# Patient Record
Sex: Female | Born: 1965 | Race: White | Hispanic: No | Smoking: Former smoker
Health system: Southern US, Community
[De-identification: ages and names within clinical notes are randomized; demographics above are authoritative.]

## PROBLEM LIST (undated history)

## (undated) DIAGNOSIS — G35 Multiple sclerosis: Secondary | ICD-10-CM

## (undated) DIAGNOSIS — F419 Anxiety disorder, unspecified: Secondary | ICD-10-CM

## (undated) DIAGNOSIS — G8929 Other chronic pain: Secondary | ICD-10-CM

## (undated) DIAGNOSIS — F32A Depression, unspecified: Secondary | ICD-10-CM

## (undated) DIAGNOSIS — F329 Major depressive disorder, single episode, unspecified: Secondary | ICD-10-CM

## (undated) HISTORY — PX: APPENDECTOMY: SHX54

## (undated) HISTORY — DX: Multiple sclerosis: G35

## (undated) HISTORY — DX: Major depressive disorder, single episode, unspecified: F32.9

## (undated) HISTORY — DX: Other chronic pain: G89.29

## (undated) HISTORY — DX: Anxiety disorder, unspecified: F41.9

## (undated) HISTORY — DX: Depression, unspecified: F32.A

## (undated) HISTORY — PX: PARTIAL COLECTOMY: SHX5273

---

## 1998-02-23 ENCOUNTER — Encounter (HOSPITAL_COMMUNITY): Admission: RE | Admit: 1998-02-23 | Discharge: 1998-05-24 | Payer: Self-pay | Admitting: Psychiatry

## 1999-05-16 ENCOUNTER — Inpatient Hospital Stay (HOSPITAL_COMMUNITY): Admission: AD | Admit: 1999-05-16 | Discharge: 1999-05-19 | Payer: Self-pay | Admitting: Obstetrics & Gynecology

## 2001-09-15 ENCOUNTER — Emergency Department (HOSPITAL_COMMUNITY): Admission: EM | Admit: 2001-09-15 | Discharge: 2001-09-16 | Payer: Self-pay | Admitting: Emergency Medicine

## 2002-02-06 ENCOUNTER — Emergency Department (HOSPITAL_COMMUNITY): Admission: EM | Admit: 2002-02-06 | Discharge: 2002-02-07 | Payer: Self-pay | Admitting: Emergency Medicine

## 2004-03-02 ENCOUNTER — Emergency Department (HOSPITAL_COMMUNITY): Admission: EM | Admit: 2004-03-02 | Discharge: 2004-03-02 | Payer: Self-pay | Admitting: *Deleted

## 2006-10-16 ENCOUNTER — Emergency Department (HOSPITAL_COMMUNITY): Admission: EM | Admit: 2006-10-16 | Discharge: 2006-10-16 | Payer: Self-pay | Admitting: Emergency Medicine

## 2008-05-23 ENCOUNTER — Inpatient Hospital Stay (HOSPITAL_COMMUNITY): Admission: AD | Admit: 2008-05-23 | Discharge: 2008-05-24 | Payer: Self-pay | Admitting: Obstetrics & Gynecology

## 2011-07-04 ENCOUNTER — Other Ambulatory Visit: Payer: Self-pay | Admitting: Diagnostic Neuroimaging

## 2011-07-04 DIAGNOSIS — R200 Anesthesia of skin: Secondary | ICD-10-CM

## 2011-07-04 DIAGNOSIS — R531 Weakness: Secondary | ICD-10-CM

## 2011-07-10 ENCOUNTER — Ambulatory Visit
Admission: RE | Admit: 2011-07-10 | Discharge: 2011-07-10 | Disposition: A | Discharge status: Home / Self Care | Payer: Self-pay | Source: Ambulatory Visit | Attending: Diagnostic Neuroimaging | Admitting: Diagnostic Neuroimaging

## 2011-07-10 DIAGNOSIS — R200 Anesthesia of skin: Secondary | ICD-10-CM

## 2011-07-10 DIAGNOSIS — R531 Weakness: Secondary | ICD-10-CM

## 2011-07-10 NOTE — Progress Notes (Signed)
Blood drawn for tests ordered by neurologist from right [facility] space; site unremarkable.

## 2011-08-22 LAB — COMPREHENSIVE METABOLIC PANEL
AST: 15
Albumin: 3.5
Calcium: 8.7
Creatinine, Ser: 0.61
GFR calc Af Amer: >60
Sodium: 136

## 2011-08-22 LAB — CBC
MCHC: 34.8
MCV: 98.1
Platelets: 292
WBC: 9.3

## 2011-08-22 LAB — URINALYSIS, ROUTINE W REFLEX MICROSCOPIC
Glucose, UA: NEGATIVE
Ketones, ur: 15 — AB
Leukocytes, UA: NEGATIVE
Nitrite: NEGATIVE
Specific Gravity, Urine: 1.02
pH: 5.5

## 2011-08-22 LAB — WET PREP, GENITAL
Clue Cells Wet Prep HPF POC: NONE SEEN
Trich, Wet Prep: NONE SEEN
Yeast Wet Prep HPF POC: NONE SEEN

## 2011-08-22 LAB — URINE MICROSCOPIC-ADD ON

## 2011-08-22 LAB — POCT PREGNANCY, URINE: Operator id: 220991

## 2012-02-06 DIAGNOSIS — M545 Low back pain, unspecified: Secondary | ICD-10-CM | POA: Diagnosis not present

## 2012-02-06 DIAGNOSIS — M542 Cervicalgia: Secondary | ICD-10-CM | POA: Diagnosis not present

## 2012-02-06 DIAGNOSIS — G894 Chronic pain syndrome: Secondary | ICD-10-CM | POA: Diagnosis not present

## 2012-04-01 DIAGNOSIS — N951 Menopausal and female climacteric states: Secondary | ICD-10-CM | POA: Diagnosis not present

## 2012-04-01 DIAGNOSIS — G894 Chronic pain syndrome: Secondary | ICD-10-CM | POA: Diagnosis not present

## 2012-05-01 DIAGNOSIS — G894 Chronic pain syndrome: Secondary | ICD-10-CM | POA: Diagnosis not present

## 2012-05-01 DIAGNOSIS — F339 Major depressive disorder, recurrent, unspecified: Secondary | ICD-10-CM | POA: Diagnosis not present

## 2012-05-26 DIAGNOSIS — F339 Major depressive disorder, recurrent, unspecified: Secondary | ICD-10-CM | POA: Diagnosis not present

## 2012-05-26 DIAGNOSIS — M722 Plantar fascial fibromatosis: Secondary | ICD-10-CM | POA: Diagnosis not present

## 2012-05-26 DIAGNOSIS — G894 Chronic pain syndrome: Secondary | ICD-10-CM | POA: Diagnosis not present

## 2012-07-22 DIAGNOSIS — F411 Generalized anxiety disorder: Secondary | ICD-10-CM | POA: Diagnosis not present

## 2012-07-22 DIAGNOSIS — G35 Multiple sclerosis: Secondary | ICD-10-CM | POA: Diagnosis not present

## 2012-07-22 DIAGNOSIS — G894 Chronic pain syndrome: Secondary | ICD-10-CM | POA: Diagnosis not present

## 2012-09-21 DIAGNOSIS — G35 Multiple sclerosis: Secondary | ICD-10-CM | POA: Diagnosis not present

## 2012-09-21 DIAGNOSIS — F411 Generalized anxiety disorder: Secondary | ICD-10-CM | POA: Diagnosis not present

## 2012-09-21 DIAGNOSIS — G894 Chronic pain syndrome: Secondary | ICD-10-CM | POA: Diagnosis not present

## 2012-12-14 DIAGNOSIS — G894 Chronic pain syndrome: Secondary | ICD-10-CM | POA: Diagnosis not present

## 2013-01-18 DIAGNOSIS — G894 Chronic pain syndrome: Secondary | ICD-10-CM | POA: Diagnosis not present

## 2013-03-08 DIAGNOSIS — F411 Generalized anxiety disorder: Secondary | ICD-10-CM | POA: Diagnosis not present

## 2013-03-08 DIAGNOSIS — G894 Chronic pain syndrome: Secondary | ICD-10-CM | POA: Diagnosis not present

## 2013-05-05 DIAGNOSIS — G894 Chronic pain syndrome: Secondary | ICD-10-CM | POA: Diagnosis not present

## 2013-07-13 DIAGNOSIS — G894 Chronic pain syndrome: Secondary | ICD-10-CM | POA: Diagnosis not present

## 2013-10-11 ENCOUNTER — Telehealth: Payer: Self-pay

## 2013-10-11 DIAGNOSIS — F411 Generalized anxiety disorder: Secondary | ICD-10-CM | POA: Diagnosis not present

## 2013-10-11 DIAGNOSIS — G894 Chronic pain syndrome: Secondary | ICD-10-CM | POA: Diagnosis not present

## 2013-10-11 DIAGNOSIS — G35 Multiple sclerosis: Secondary | ICD-10-CM | POA: Diagnosis not present

## 2013-10-11 NOTE — Telephone Encounter (Signed)
Spoke to patient. Scheduled appt per Dr. Marjory Lies on 10/12/13.  Patient agreed.

## 2013-10-12 ENCOUNTER — Ambulatory Visit (INDEPENDENT_AMBULATORY_CARE_PROVIDER_SITE_OTHER): Payer: Medicare Other | Admitting: Diagnostic Neuroimaging

## 2013-10-12 ENCOUNTER — Encounter: Payer: Self-pay | Admitting: Diagnostic Neuroimaging

## 2013-10-12 VITALS — BP 115/77 | HR 61 | Temp 97.9°F | Ht 63.5 in | Wt 215.0 lb

## 2013-10-12 DIAGNOSIS — G894 Chronic pain syndrome: Secondary | ICD-10-CM

## 2013-10-12 DIAGNOSIS — F319 Bipolar disorder, unspecified: Secondary | ICD-10-CM

## 2013-10-12 DIAGNOSIS — G35 Multiple sclerosis: Secondary | ICD-10-CM

## 2013-10-12 DIAGNOSIS — R5381 Other malaise: Secondary | ICD-10-CM | POA: Diagnosis not present

## 2013-10-12 NOTE — Progress Notes (Signed)
[facility]  PATIENT: Christina Shea DOB: July 26, 1966  REFERRING CLINICIAN: Foy Guadalajara HISTORY FROM: patient  REASON FOR VISIT: follow up   HISTORICAL  CHIEF COMPLAINT:  Chief Complaint  Patient presents with  . Follow-up    Rv# MS    HISTORY OF PRESENT ILLNESS:   UPDATE 10/12/13: Last visit patient did not followup. I had advised her to consider Copaxone at last visit 2012. However patient was concerned about side effects medication and opted to continue with her pain management and mood stabilization with her PCP Dr. Foy Guadalajara.  Over the past one year she has had intermittent feelings of fatigue, malaise, diffuse pain. She denies any focal numbness or weakness. No unilateral visual abnormalities. No stumbling or falls. Sunday she feels like she cannot out of bed. She struggles with significant psychosocial stressors. She is having difficulty even getting out of bed and out of the house to go to her doctor's appointments. She has not seen psychiatry currently. She does feel that pain and mood play a strong role in her outlook on life.  I reviewed prior testing from 2012 which shows stable left frontal focus of gliosis in the brain and ill-defined T2 hyperintense lesions in the cervical spinal cord. Lumbar puncture was negative for oligoclonal bands, and showed normal IgG index.  PRIOR HPI (06/04/11): 47 year old right-handed female with history of multiple sclerosis, chronic pain, bipolar disorder, anxiety here for evaluation of multiple sclerosis.  June 2000, patient was 1 week post partum and given a diagnosis of multiple sclerosis. She had developed left face numbness for several days during her pregnancy. Over time she's had intermittent episodes of numbness and weakness in her legs. She also has balance difficulties chronic pain cognitive difficulty.  Last MRI 12/24/06 showed solitart left frontal focus of gliosis (5x60mm).  She has not had MRI of her spine or LP ever.      She was on betaseron from 2000 - 2002.  She had to stop due to flu-like side effects.  Then lost to follow up.  Came back to Dr. Thad Ranger briefly in 2008, was planned to start avonex, but then lost to follow up again.   REVIEW OF SYSTEMS: Full 14 system review of systems performed and notable only for weakness depression anxiety decrease aching muscles feeling hot weight gain.  ALLERGIES: No Known Allergies  HOME MEDICATIONS: Outpatient Prescriptions Prior to Visit  Medication Sig Dispense Refill  . ALPRAZolam (XANAX) 1 MG tablet Take 1 mg by mouth 3 (three) times daily.      Marland Kitchen oxycodone (ROXICODONE) 30 MG immediate release tablet Take 30 mg by mouth 3 (three) times daily as needed for pain (2 four times a day prn).       Marland Kitchen oxymorphone (OPANA ER) 40 MG 12 hr tablet Take 40 mg by mouth every 12 (twelve) hours.      . sertraline (ZOLOFT) 100 MG tablet Take 100 mg by mouth 2 (two) times daily.       . methadone (DOLOPHINE) 10 MG tablet Take 10 mg by mouth 2 (two) times daily as needed.       No facility-administered medications prior to visit.    PAST MEDICAL HISTORY: Past Medical History  Diagnosis Date  . Multiple sclerosis   . Chronic pain   . Depression   . Anxiety     PAST SURGICAL HISTORY: Past Surgical History  Procedure Laterality Date  . Appendectomy  [id number]  . Partial colectomy  [phone]  FAMILY HISTORY: History reviewed. No pertinent family history.  SOCIAL HISTORY:  History   Social History  . Marital Status: Legally Separated    Spouse Name: N/A    Number of Children: 2  . Years of Education: HS   Occupational History  . Not on file.   Social History Main Topics  . Smoking status: Never Smoker   . Smokeless tobacco: Not on file  . Alcohol Use: No     Comment: occasionally  . Drug Use: No  . Sexual Activity: No   Other Topics Concern  . Not on file   Social History Narrative   Patient lives at home with her family.   Caffeine Use:  daily     PHYSICAL EXAM  Filed Vitals:   10/12/13 1325  BP: 115/77  Pulse: 61  Temp: 97.9 F (36.6 C)  TempSrc: Oral  Height: 5' 3.5" (1.613 m)  Weight: 215 lb (97.523 kg)    Not recorded    Body mass index is 37.48 kg/(m^2).  GENERAL EXAM: Patient is in no distress  CARDIOVASCULAR: Regular rate and rhythm, no murmurs, no carotid bruits  NEUROLOGIC: MENTAL STATUS: awake, alert, language fluent, comprehension intact, naming intact CRANIAL NERVE: no papilledema on fundoscopic exam, pupils equal and reactive to light, visual fields full to confrontation, extraocular muscles intact, no nystagmus, facial sensation and strength symmetric, uvula midline, shoulder shrug symmetric, tongue midline. MOTOR: normal bulk and tone, full strength in the BUE, BLE SENSORY: normal and symmetric to light touch, temperature, vibration COORDINATION: finger-nose-finger, fine finger movements normal REFLEXES: deep tendon reflexes present and symmetric; BRISK AT KNEES. GAIT/STATION: narrow based gait; able to walk on toes, heels and tandem; romberg is negative   DIAGNOSTIC DATA (LABS, IMAGING, TESTING) - I reviewed patient records, labs, notes, testing and imaging myself where available.  Lab Results  Component Value Date   WBC 9.3 05/23/2008   HGB 12.7 05/23/2008   HCT 36.7 05/23/2008   MCV 98.1 05/23/2008   PLT 292 05/23/2008      Component Value Date/Time   NA 136 05/23/2008 2250   K 3.5 05/23/2008 2250   CL 102 05/23/2008 2250   CO2 27 05/23/2008 2250   GLUCOSE 89 05/23/2008 2250   BUN 6 05/23/2008 2250   CREATININE 0.61 05/23/2008 2250   CALCIUM 8.7 05/23/2008 2250   PROT 6.6 05/23/2008 2250   ALBUMIN 3.5 05/23/2008 2250   AST 15 05/23/2008 2250   ALT 15 05/23/2008 2250   ALKPHOS 52 05/23/2008 2250   BILITOT 0.7 05/23/2008 2250   GFRNONAA >60 05/23/2008 2250   GFRAA  Value: >60        The eGFR has been calculated using the MDRD equation. This calculation has not been validated in all  clinical 05/23/2008 2250   No results found for this basename: CHOL, HDL, LDLCALC, LDLDIRECT, TRIG, CHOLHDL   No results found for this basename: HGBA1C   No results found for this basename: VITAMINB12   No results found for this basename: TSH    07/11/11 B12 613 HIV, ANA, ANCA, RPR - neg CSF: OCBs negative, IgG index 0.51 (nl <0.66), glucose 64, protein 42  06/07/11 MRI brain - solitary nonspecific left posterior frontal white matter hyperintensity which is unchanged from MRI 12/24/06.  06/07/11 MRI cervical spine - minor disc degenerative changes. Ill defined spinal cord hyperintensities at C 2, C 3 and C 4 may represent remote age demyelinating plaques. No enhancing lesions are noted.  06/11/11 MRI thoracic spine -  normal    ASSESSMENT AND PLAN  47 y.o. year old female here with long-standing chronic pain, bipolar disorder, with prior diagnosis of possible multiple sclerosis. When I repeated her workup it is still unclear whether she truly has multiple sclerosis (stable MRI from 2008 to 2012 and LP negative for oligoclonal bands). Also her ongoing difficulties over the past year are somewhat generalized and vague, and not necessarily indicative of a multiple sclerosis exacerbation warranting IV steroids at this time. I would like to check MRI of the brain and cervical spine, and confirm whether or not she has acute/enhancing demyelinating lesions. Also we would like to determine if she has had some progression since 2012. Based on this we may consider IV steroids and disease modifying therapy such as Copaxone or interferon.   PLAN: - repeat MRIs to look for progression or flare up - then may consider steroids + DMT (copaxone or plegridy) - continue pain mgmt and mood stabilization per PCP; consider psychology/psychiatry evaluation  Orders Placed This Encounter  Procedures  . MR Brain W Wo Contrast  . MR Cervical Spine W Wo Contrast    Return in about 3 months (around 01/12/2014)  for with Heide Guile or Nancyjo Givhan.    Suanne Marker, MD 10/12/2013, 2:28 PM Certified in Neurology, Neurophysiology and Neuroimaging  [facility] [address] [place], [place] [address] [phone]

## 2013-10-12 NOTE — Patient Instructions (Signed)
I will check MRI scans. 

## 2013-10-28 ENCOUNTER — Other Ambulatory Visit: Payer: Self-pay

## 2013-10-28 ENCOUNTER — Inpatient Hospital Stay: Admission: RE | Admit: 2013-10-28 | Payer: Self-pay | Source: Ambulatory Visit

## 2013-11-04 ENCOUNTER — Encounter: Payer: Self-pay | Admitting: Diagnostic Neuroimaging

## 2013-11-23 DIAGNOSIS — G894 Chronic pain syndrome: Secondary | ICD-10-CM | POA: Diagnosis not present

## 2014-01-14 ENCOUNTER — Ambulatory Visit: Payer: Medicare Other | Admitting: Nurse Practitioner

## 2014-01-14 ENCOUNTER — Telehealth: Payer: Self-pay | Admitting: Nurse Practitioner

## 2014-01-14 NOTE — Telephone Encounter (Signed)
Patient was no show for appointment

## 2014-02-17 DIAGNOSIS — G894 Chronic pain syndrome: Secondary | ICD-10-CM | POA: Diagnosis not present

## 2014-05-19 DIAGNOSIS — F411 Generalized anxiety disorder: Secondary | ICD-10-CM | POA: Diagnosis not present

## 2014-05-19 DIAGNOSIS — G894 Chronic pain syndrome: Secondary | ICD-10-CM | POA: Diagnosis not present

## 2014-06-13 DIAGNOSIS — F411 Generalized anxiety disorder: Secondary | ICD-10-CM | POA: Diagnosis not present

## 2014-06-13 DIAGNOSIS — G894 Chronic pain syndrome: Secondary | ICD-10-CM | POA: Diagnosis not present

## 2014-06-13 DIAGNOSIS — Z79899 Other long term (current) drug therapy: Secondary | ICD-10-CM | POA: Diagnosis not present

## 2014-08-10 ENCOUNTER — Emergency Department (HOSPITAL_COMMUNITY): Payer: Medicare Other

## 2014-08-10 ENCOUNTER — Emergency Department (HOSPITAL_COMMUNITY)
Admission: EM | Admit: 2014-08-10 | Discharge: 2014-08-10 | Disposition: A | Discharge status: Home / Self Care | Payer: Medicare Other | Attending: Emergency Medicine | Admitting: Emergency Medicine

## 2014-08-10 ENCOUNTER — Encounter (HOSPITAL_COMMUNITY): Payer: Self-pay | Admitting: Emergency Medicine

## 2014-08-10 DIAGNOSIS — F411 Generalized anxiety disorder: Secondary | ICD-10-CM | POA: Diagnosis not present

## 2014-08-10 DIAGNOSIS — Z8669 Personal history of other diseases of the nervous system and sense organs: Secondary | ICD-10-CM | POA: Diagnosis not present

## 2014-08-10 DIAGNOSIS — F3289 Other specified depressive episodes: Secondary | ICD-10-CM | POA: Insufficient documentation

## 2014-08-10 DIAGNOSIS — R109 Unspecified abdominal pain: Secondary | ICD-10-CM | POA: Diagnosis not present

## 2014-08-10 DIAGNOSIS — R197 Diarrhea, unspecified: Secondary | ICD-10-CM | POA: Insufficient documentation

## 2014-08-10 DIAGNOSIS — R112 Nausea with vomiting, unspecified: Secondary | ICD-10-CM | POA: Diagnosis not present

## 2014-08-10 DIAGNOSIS — Z79899 Other long term (current) drug therapy: Secondary | ICD-10-CM | POA: Insufficient documentation

## 2014-08-10 DIAGNOSIS — R5381 Other malaise: Secondary | ICD-10-CM | POA: Diagnosis not present

## 2014-08-10 DIAGNOSIS — R1110 Vomiting, unspecified: Secondary | ICD-10-CM

## 2014-08-10 DIAGNOSIS — Z0389 Encounter for observation for other suspected diseases and conditions ruled out: Secondary | ICD-10-CM | POA: Diagnosis not present

## 2014-08-10 DIAGNOSIS — G8929 Other chronic pain: Secondary | ICD-10-CM | POA: Insufficient documentation

## 2014-08-10 DIAGNOSIS — F329 Major depressive disorder, single episode, unspecified: Secondary | ICD-10-CM | POA: Insufficient documentation

## 2014-08-10 DIAGNOSIS — R404 Transient alteration of awareness: Secondary | ICD-10-CM | POA: Diagnosis not present

## 2014-08-10 LAB — COMPREHENSIVE METABOLIC PANEL
ALBUMIN: 4 g/dL (ref 3.5–5.2)
ALK PHOS: 85 U/L (ref 39–117)
ALT: 17 U/L (ref 0–35)
ANION GAP: 17 — AB (ref 5–15)
AST: 28 U/L (ref 0–37)
BILIRUBIN TOTAL: 0.5 mg/dL (ref 0.3–1.2)
BUN: 6 mg/dL (ref 6–23)
CHLORIDE: 106 meq/L (ref 96–112)
CO2: 20 mEq/L (ref 19–32)
Calcium: 9.8 mg/dL (ref 8.4–10.5)
Creatinine, Ser: 0.69 mg/dL (ref 0.50–1.10)
GFR calc Af Amer: >90 mL/min (ref >90)
GFR calc non Af Amer: >90 mL/min (ref >90)
Glucose, Bld: 121 mg/dL — ABNORMAL HIGH (ref 70–99)
POTASSIUM: 3.4 meq/L — AB (ref 3.7–5.3)
Sodium: 143 mEq/L (ref 137–147)
TOTAL PROTEIN: 8.4 g/dL — AB (ref 6.0–8.3)

## 2014-08-10 LAB — CBC WITH DIFFERENTIAL/PLATELET
BASOS ABS: 0 10*3/uL (ref 0.0–0.1)
BASOS PCT: 0 % (ref 0–1)
EOS ABS: 0 10*3/uL (ref 0.0–0.7)
Eosinophils Relative: 0 % (ref 0–5)
HCT: 41.7 % (ref 36.0–46.0)
Hemoglobin: 14.3 g/dL (ref 12.0–15.0)
Lymphocytes Relative: 15 % (ref 12–46)
Lymphs Abs: 1.3 10*3/uL (ref 0.7–4.0)
MCH: 30.6 pg (ref 26.0–34.0)
MCHC: 34.3 g/dL (ref 30.0–36.0)
MCV: 89.3 fL (ref 78.0–100.0)
Monocytes Absolute: 0.6 10*3/uL (ref 0.1–1.0)
Monocytes Relative: 7 % (ref 3–12)
NEUTROS ABS: 6.8 10*3/uL (ref 1.7–7.7)
NEUTROS PCT: 78 % — AB (ref 43–77)
PLATELETS: 303 10*3/uL (ref 150–400)
RBC: 4.67 MIL/uL (ref 3.87–5.11)
RDW: 15.1 % (ref 11.5–15.5)
WBC: 8.7 10*3/uL (ref 4.0–10.5)

## 2014-08-10 LAB — LIPASE, BLOOD: LIPASE: 61 U/L — AB (ref 11–59)

## 2014-08-10 LAB — TROPONIN I: Troponin I: <0.3 ng/mL (ref <0.30)

## 2014-08-10 MED ORDER — HYDROMORPHONE HCL 1 MG/ML IJ SOLN
1.0000 mg | Freq: Once | INTRAMUSCULAR | Status: AC
  Administered 2014-08-10: 1 mg via INTRAVENOUS
  Filled 2014-08-10: qty 1

## 2014-08-10 MED ORDER — ONDANSETRON HCL 4 MG/2ML IJ SOLN
4.0000 mg | Freq: Once | INTRAMUSCULAR | Status: AC
  Administered 2014-08-10: 4 mg via INTRAVENOUS
  Filled 2014-08-10: qty 2

## 2014-08-10 MED ORDER — METOCLOPRAMIDE HCL 5 MG/ML IJ SOLN
10.0000 mg | Freq: Once | INTRAMUSCULAR | Status: AC
  Administered 2014-08-10: 10 mg via INTRAVENOUS
  Filled 2014-08-10: qty 2

## 2014-08-10 MED ORDER — LOPERAMIDE HCL 2 MG PO CAPS: 2.0000 mg | ORAL_CAPSULE | ORAL | Status: AC | PRN

## 2014-08-10 MED ORDER — LORAZEPAM 2 MG/ML IJ SOLN
1.0000 mg | Freq: Once | INTRAMUSCULAR | Status: AC
Start: 2014-08-10 — End: 2014-08-10
  Administered 2014-08-10: 1 mg via INTRAVENOUS
  Filled 2014-08-10: qty 1

## 2014-08-10 MED ORDER — PROMETHAZINE HCL 25 MG PO TABS: 25.0000 mg | ORAL_TABLET | Freq: Four times a day (QID) | ORAL | Status: AC | PRN

## 2014-08-10 MED ORDER — SODIUM CHLORIDE 0.9 % IV BOLUS (SEPSIS)
1000.0000 mL | Freq: Once | INTRAVENOUS | Status: AC
  Administered 2014-08-10: 1000 mL via INTRAVENOUS

## 2014-08-10 MED ORDER — HYDROMORPHONE HCL PF 1 MG/ML IJ SOLN
1.0000 mg | Freq: Once | INTRAMUSCULAR | Status: AC
  Administered 2014-08-10: 1 mg via INTRAVENOUS
  Filled 2014-08-10: qty 1

## 2014-08-10 NOTE — ED Notes (Signed)
MD aware patient unable to urinate. Aware patient is in pain and vomiting.

## 2014-08-10 NOTE — ED Provider Notes (Signed)
CSN: [id number]     Arrival date & time 08/10/14  6578 History   First MD Initiated Contact with Patient 08/10/14 [phone]     Chief Complaint  Patient presents with  . Nausea  . Emesis  . Diarrhea     (Consider location/radiation/quality/duration/timing/severity/associated sxs/prior Treatment) HPI Comments: Patient presents to the ER for evaluation of nausea, vomiting, diarrhea. Patient reports that symptoms began 2 days ago. She started with diarrhea and then started having nausea and vomiting. Patient has a history of chronic pain syndrome, on multiple narcotic analgesics regularly. She has not been able to hold these down. Patient is now complaining of pain all over. She cannot pinpoint her pain as, reports 10 out of 10 pain.  Patient is a 48 y.o. female presenting with vomiting and diarrhea.  Emesis Associated symptoms: diarrhea   Diarrhea Associated symptoms: vomiting     Past Medical History  Diagnosis Date  . Multiple sclerosis   . Chronic pain   . Depression   . Anxiety    Past Surgical History  Procedure Laterality Date  . Appendectomy  [id number]  . Partial colectomy  1989-1990   No family history on file. History  Substance Use Topics  . Smoking status: Never Smoker   . Smokeless tobacco: Not on file  . Alcohol Use: No     Comment: occasionally   OB History   Grav Para Term Preterm Abortions TAB SAB Ect Mult Living                 Review of Systems  Gastrointestinal: Positive for vomiting and diarrhea.  All other systems reviewed and are negative.     Allergies  Review of patient's allergies indicates no known allergies.  Home Medications   Prior to Admission medications   Medication Sig Start Date End Date Taking? Authorizing Provider  ALPRAZolam Prudy Feeler) 1 MG tablet Take 1 mg by mouth 3 (three) times daily.   Yes Historical Provider, MD  oxycodone (ROXICODONE) 30 MG immediate release tablet Take 30 mg by mouth 3 (three) times daily as needed for  pain (2 four times a day prn).    Yes Historical Provider, MD  oxymorphone (OPANA ER) 40 MG 12 hr tablet Take 40 mg by mouth every 12 (twelve) hours.   Yes Historical Provider, MD  sertraline (ZOLOFT) 100 MG tablet Take 100 mg by mouth 2 (two) times daily.   Yes Historical Provider, MD  loperamide (IMODIUM) 2 MG capsule Take 1 capsule (2 mg total) by mouth as needed for diarrhea or loose stools. 08/10/14   Gilda Crease, MD  promethazine (PHENERGAN) 25 MG tablet Take 1 tablet (25 mg total) by mouth every 6 (six) hours as needed for nausea or vomiting. 08/10/14   Gilda Crease, MD   BP 122/61  Pulse 60  Temp(Src) 98.2 F (36.8 C)  Resp 18  SpO2 95% Physical Exam  Constitutional: She is oriented to person, place, and time. She appears well-developed and well-nourished. No distress.  HENT:  Head: Normocephalic and atraumatic.  Right Ear: Hearing normal.  Left Ear: Hearing normal.  Nose: Nose normal.  Mouth/Throat: Oropharynx is clear and moist and mucous membranes are normal.  Eyes: Conjunctivae and EOM are normal. Pupils are equal, round, and reactive to light.  Neck: Normal range of motion. Neck supple.  Cardiovascular: Regular rhythm, S1 normal and S2 normal.  Exam reveals no gallop and no friction rub.   No murmur heard. Pulmonary/Chest: Effort normal and breath sounds  normal. No respiratory distress. She exhibits no tenderness.  Abdominal: Soft. Normal appearance and bowel sounds are normal. There is no hepatosplenomegaly. There is no tenderness. There is no rebound, no guarding, no tenderness at McBurney's point and negative Murphy's sign. No hernia.  Musculoskeletal: Normal range of motion.  Neurological: She is alert and oriented to person, place, and time. She has normal strength. No cranial nerve deficit or sensory deficit. Coordination normal. GCS eye subscore is 4. GCS verbal subscore is 5. GCS motor subscore is 6.  Skin: Skin is warm, dry and intact. No rash  noted. No cyanosis.  Psychiatric: She has a normal mood and affect. Her speech is normal and behavior is normal. Thought content normal.    ED Course  Procedures (including critical care time) Labs Review Labs Reviewed  CBC WITH DIFFERENTIAL - Abnormal; Notable for the following:    Neutrophils Relative % 78 (*)    All other components within normal limits  COMPREHENSIVE METABOLIC PANEL - Abnormal; Notable for the following:    Potassium 3.4 (*)    Glucose, Bld 121 (*)    Total Protein 8.4 (*)    Anion gap 17 (*)    All other components within normal limits  LIPASE, BLOOD - Abnormal; Notable for the following:    Lipase 61 (*)    All other components within normal limits  TROPONIN I  URINALYSIS, ROUTINE W REFLEX MICROSCOPIC  URINE RAPID DRUG SCREEN (HOSP PERFORMED)    Imaging Review Dg Abd Acute W/chest  08/10/2014   CLINICAL DATA:  Severe abdominal pain  EXAM: ACUTE ABDOMEN SERIES (ABDOMEN 2 VIEW & CHEST 1 VIEW)  COMPARISON:  None.  FINDINGS: Lungs are clear.  No free air beneath hemidiaphragm.  Midline loops of large or small bowel. Surgical clips in the right abdomen associated with the colon. Gas in the rectum. No organomegaly. No pathologic calcifications.  IMPRESSION: 1.  No acute cardiopulmonary process. 2. No bowel obstruction or  free air. 3. Prior bowel surgery in the right lower quadrant.   Electronically Signed   By: Genevive Bi M.D.   On: 08/10/2014 08:24     EKG Interpretation   Date/Time:  Wednesday August 10 2014 08:19:27 EDT Ventricular Rate:  63 PR Interval:  151 QRS Duration: 93 QT Interval:  452 QTC Calculation: 463 R Axis:   -30 Text Interpretation:  Normal sinus rhythm Left axis deviation Non-specific  ST-t changes No previous tracing Confirmed by POLLINA  MD, CHRISTOPHER  [phone]) on 08/10/2014 8:24:30 AM      MDM   Final diagnoses:  Acute vomiting  Diarrhea  Chronic pain    Patient presents to the ER for evaluation of nausea vomiting  and diarrhea. Patient has a history of chronic pain syndrome, has not been able to do her medications. Patient is likely having withdrawal from her narcotic analgesics as well as Xanax. This was precipitated by her inability to hold down her medications from what is probably a viral process. Do not have any point tenderness in the abdomen. Lab work was entirely normal. She did not have any significant improvement with IV Dilantin, but had significant improvement after Reglan and Ativan. Patient is now comfortable, no further vomiting. She'll be discharged, continue her normal medications. Was prescribed Phenergan and Lomotil for his GI symptoms. Return if symptoms worsen.    Gilda Crease, MD 08/10/14 1538

## 2014-08-10 NOTE — ED Notes (Signed)
Patient transported to X-ray 

## 2014-08-10 NOTE — ED Notes (Signed)
Pt aware urine sample is needed. Crying and tearful saying "I'm just too sick to be here." Moving all extremities equally.

## 2014-08-10 NOTE — ED Notes (Signed)
Per ems pt co of [facility] for 24 hours. Pt received 4 mg Zofran. Pt  Presents to ED moaning yet denies localized pain. Pt alert and oriented.

## 2014-08-10 NOTE — Discharge Instructions (Signed)

## 2014-08-10 NOTE — ED Notes (Signed)
Bed: WA03 Expected date: 08/10/14 Expected time: 7:13 AM Means of arrival: Ambulance Comments: N,v,d

## 2014-08-10 NOTE — ED Notes (Signed)
Pt attempted to get up to provide urine specimen . Pt was unsuccessful at this time.

## 2014-08-10 NOTE — ED Notes (Signed)
Pt made aware of need of UA pt states she can not void at this time.

## 2014-08-10 NOTE — ED Notes (Signed)
Pt ambulated to restroom and vomited medium emesis.

## 2014-08-10 NOTE — Progress Notes (Signed)
[facility] Community Liaison Stacy, ° °Provided pt with a list of self-pay providers to help patient establish primary care.  °

## 2014-08-10 NOTE — ED Notes (Signed)
AVS explained in detail. Knows not to drink/drive/operate heavy machinery with Phenergan prescription. Acknowledges understanding. Ambulatory. No other questions/concerns.

## 2014-09-15 DIAGNOSIS — G35 Multiple sclerosis: Secondary | ICD-10-CM | POA: Diagnosis not present

## 2014-09-15 DIAGNOSIS — M545 Low back pain: Secondary | ICD-10-CM | POA: Diagnosis not present

## 2014-09-15 DIAGNOSIS — F419 Anxiety disorder, unspecified: Secondary | ICD-10-CM | POA: Diagnosis not present

## 2014-09-15 DIAGNOSIS — G894 Chronic pain syndrome: Secondary | ICD-10-CM | POA: Diagnosis not present

## 2014-12-26 DIAGNOSIS — M545 Low back pain: Secondary | ICD-10-CM | POA: Diagnosis not present

## 2014-12-26 DIAGNOSIS — F419 Anxiety disorder, unspecified: Secondary | ICD-10-CM | POA: Diagnosis not present

## 2014-12-26 DIAGNOSIS — F4320 Adjustment disorder, unspecified: Secondary | ICD-10-CM | POA: Diagnosis not present

## 2014-12-26 DIAGNOSIS — G894 Chronic pain syndrome: Secondary | ICD-10-CM | POA: Diagnosis not present

## 2014-12-26 DIAGNOSIS — G35 Multiple sclerosis: Secondary | ICD-10-CM | POA: Diagnosis not present

## 2015-02-07 DIAGNOSIS — F4320 Adjustment disorder, unspecified: Secondary | ICD-10-CM | POA: Diagnosis not present

## 2015-02-07 DIAGNOSIS — G894 Chronic pain syndrome: Secondary | ICD-10-CM | POA: Diagnosis not present

## 2015-02-07 DIAGNOSIS — G35 Multiple sclerosis: Secondary | ICD-10-CM | POA: Diagnosis not present

## 2015-02-07 DIAGNOSIS — M545 Low back pain: Secondary | ICD-10-CM | POA: Diagnosis not present

## 2015-04-04 DIAGNOSIS — M461 Sacroiliitis, not elsewhere classified: Secondary | ICD-10-CM | POA: Diagnosis not present

## 2015-04-04 DIAGNOSIS — G894 Chronic pain syndrome: Secondary | ICD-10-CM | POA: Diagnosis not present

## 2015-04-04 DIAGNOSIS — G35 Multiple sclerosis: Secondary | ICD-10-CM | POA: Diagnosis not present

## 2015-04-04 DIAGNOSIS — M7062 Trochanteric bursitis, left hip: Secondary | ICD-10-CM | POA: Diagnosis not present

## 2015-05-11 ENCOUNTER — Ambulatory Visit: Payer: Medicare Other | Admitting: Diagnostic Neuroimaging

## 2015-05-22 ENCOUNTER — Encounter: Payer: Self-pay | Admitting: *Deleted

## 2015-05-22 ENCOUNTER — Ambulatory Visit (INDEPENDENT_AMBULATORY_CARE_PROVIDER_SITE_OTHER): Payer: Medicare Other | Admitting: Diagnostic Neuroimaging

## 2015-05-22 VITALS — BP 99/61 | HR 76 | Ht 63.5 in | Wt 208.0 lb

## 2015-05-22 DIAGNOSIS — G35 Multiple sclerosis: Secondary | ICD-10-CM | POA: Diagnosis not present

## 2015-05-22 NOTE — Progress Notes (Signed)
[facility]  PATIENT: Christina Shea DOB: 26-Sep-1966  REFERRING CLINICIAN: Foy Guadalajara HISTORY FROM: patient  REASON FOR VISIT: follow up   HISTORICAL  CHIEF COMPLAINT:  Chief Complaint  Patient presents with  . Multiple sclerosis    possible fibromyalgia, rm 6 , friend  . Follow-up    HISTORY OF PRESENT ILLNESS:   UPDATE 05/22/15: Since last visit, did not follow up. Now here to follow up to re-establish. Still with mood instability, pain, left hip and left leg pain. She has not established with pain mgmt or psychiatry. She has significant lack of social support. No new neuro symptoms, other than left hip pain.   UPDATE 10/12/13: Last visit patient did not followup. I had advised her to consider Copaxone at last visit 2012. However patient was concerned about side effects medication and opted to continue with her pain management and mood stabilization with her PCP Dr. Foy Guadalajara.  Over the past one year she has had intermittent feelings of fatigue, malaise, diffuse pain. She denies any focal numbness or weakness. No unilateral visual abnormalities. No stumbling or falls. Sunday she feels like she cannot out of bed. She struggles with significant psychosocial stressors. She is having difficulty even getting out of bed and out of the house to go to her doctor's appointments. She has not seen psychiatry currently. She does feel that pain and mood play a strong role in her outlook on life. I reviewed prior testing from 2012 which shows stable left frontal focus of gliosis in the brain and ill-defined T2 hyperintense lesions in the cervical spinal cord. Lumbar puncture was negative for oligoclonal bands, and showed normal IgG index.  PRIOR HPI (06/04/11): 49 year old right-handed female with history of multiple sclerosis, chronic pain, bipolar disorder, anxiety here for evaluation of multiple sclerosis. June 2000, patient was 1 week post partum and given a diagnosis of multiple  sclerosis. She had developed left face numbness for several days during her pregnancy. Over time she's had intermittent episodes of numbness and weakness in her legs. She also has balance difficulties chronic pain cognitive difficulty.  Last MRI 12/24/06 showed solitart left frontal focus of gliosis (5x54mm).  She has not had MRI of her spine or LP ever. She was on betaseron from 2000 - 2002.  She had to stop due to flu-like side effects.  Then lost to follow up.  Came back to Dr. Thad Ranger briefly in 2008, was planned to start avonex, but then lost to follow up again.   REVIEW OF SYSTEMS: Full 14 system review of systems performed and notable only for numbness weakness tremors agitation depression anxiety joint pain joint swelling back pain cramps walking difficulty daytime sleepiness leg swelling heat intolerance excessive thirst fatigue.   ALLERGIES: No Known Allergies  HOME MEDICATIONS: Outpatient Prescriptions Prior to Visit  Medication Sig Dispense Refill  . ALPRAZolam (XANAX) 1 MG tablet Take 1 mg by mouth 3 (three) times daily.    Marland Kitchen oxycodone (ROXICODONE) 30 MG immediate release tablet Take 30 mg by mouth 3 (three) times daily as needed for pain (2 four times a day prn).     Marland Kitchen oxymorphone (OPANA ER) 40 MG 12 hr tablet Take 40 mg by mouth every 12 (twelve) hours.    . sertraline (ZOLOFT) 100 MG tablet Take 100 mg by mouth 2 (two) times daily.    Marland Kitchen loperamide (IMODIUM) 2 MG capsule Take 1 capsule (2 mg total) by mouth as needed for diarrhea or loose stools. (Patient not taking:  Reported on 05/22/2015) 30 capsule 0  . promethazine (PHENERGAN) 25 MG tablet Take 1 tablet (25 mg total) by mouth every 6 (six) hours as needed for nausea or vomiting. (Patient not taking: Reported on 05/22/2015) 30 tablet 0   No facility-administered medications prior to visit.    PAST MEDICAL HISTORY: Past Medical History  Diagnosis Date  . Multiple sclerosis   . Chronic pain     managed by Dr Foy Guadalajara  .  Depression   . Anxiety     PAST SURGICAL HISTORY: Past Surgical History  Procedure Laterality Date  . Appendectomy  [id number]  . Partial colectomy  1989-1990    FAMILY HISTORY: Family History  Problem Relation Age of Onset  . Leukemia Mother     SOCIAL HISTORY:  History   Social History  . Marital Status: Legally Separated    Spouse Name: N/A  . Number of Children: 2  . Years of Education: HS   Occupational History  . Not on file.   Social History Main Topics  . Smoking status: Former Smoker    Quit date: 05/21/2010  . Smokeless tobacco: Not on file  . Alcohol Use: No     Comment: occasionally  . Drug Use: No  . Sexual Activity: No   Other Topics Concern  . Not on file   Social History Narrative   Patient lives at home with her family.   Caffeine Use: daily sodas x 2     PHYSICAL EXAM  Filed Vitals:   05/22/15 1509  BP: 99/61  Pulse: 76  Height: 5' 3.5" (1.613 m)  Weight: 208 lb (94.348 kg)    Not recorded      Body mass index is 36.26 kg/(m^2).  GENERAL EXAM: Patient is in no distress; TEARFUL; RESTLESS, MOVING AROUND, CANNOT SIT OR STAND FOR TOO LONG; TEARFUL  CARDIOVASCULAR: Regular rate and rhythm, no murmurs, no carotid bruits  NEUROLOGIC: MENTAL STATUS: awake, alert, language fluent, comprehension intact, naming intact CRANIAL NERVE: pupils equal and reactive to light, visual fields full to confrontation, extraocular muscles intact, no nystagmus, facial sensation and strength symmetric, uvula midline, shoulder shrug symmetric, tongue midline. MOTOR: normal bulk and tone, full strength in the BUE, BLE SENSORY: normal and symmetric to light touch, temperature, vibration COORDINATION: finger-nose-finger, fine finger movements normal REFLEXES: deep tendon reflexes present and symmetric; BRISK AT KNEES. GAIT/STATION: narrow based gait; ANTALGIC GAIT    DIAGNOSTIC DATA (LABS, IMAGING, TESTING) - I reviewed patient records, labs, notes,  testing and imaging myself where available.  Lab Results  Component Value Date   WBC 8.7 08/10/2014   HGB 14.3 08/10/2014   HCT 41.7 08/10/2014   MCV 89.3 08/10/2014   PLT 303 08/10/2014      Component Value Date/Time   NA 143 08/10/2014 0825   K 3.4* 08/10/2014 0825   CL 106 08/10/2014 0825   CO2 20 08/10/2014 0825   GLUCOSE 121* 08/10/2014 0825   BUN 6 08/10/2014 0825   CREATININE 0.69 08/10/2014 0825   CALCIUM 9.8 08/10/2014 0825   PROT 8.4* 08/10/2014 0825   ALBUMIN 4.0 08/10/2014 0825   AST 28 08/10/2014 0825   ALT 17 08/10/2014 0825   ALKPHOS 85 08/10/2014 0825   BILITOT 0.5 08/10/2014 0825   GFRNONAA >90 08/10/2014 0825   GFRAA >90 08/10/2014 0825   No results found for: CHOL No results found for: HGBA1C No results found for: VITAMINB12 No results found for: TSH  07/11/11 B12 613 HIV, ANA, ANCA, RPR -  neg CSF: OCBs negative, IgG index 0.51 (nl <0.66), glucose 64, protein 42  06/07/11 MRI brain - solitary nonspecific left posterior frontal white matter hyperintensity which is unchanged from MRI 12/24/06.  06/07/11 MRI cervical spine - minor disc degenerative changes. Ill defined spinal cord hyperintensities at C 2, C 3 and C 4 may represent remote age demyelinating plaques. No enhancing lesions are noted.  06/11/11 MRI thoracic spine - normal   ASSESSMENT AND PLAN  49 y.o. year old female here with long-standing chronic pain, bipolar disorder, with prior diagnosis of possible multiple sclerosis. When I repeated her workup IN 2012, it is still unclear whether she truly has multiple sclerosis or some other monophasic demyelinating / autoimmune disease (stable MRI from 2008 to 2012 and LP negative for oligoclonal bands).   I would like to check MRI of the brain and spine, and confirm whether or not she has acute/enhancing demyelinating lesions. Also we would like to determine if she has had some progression since 2012. Based on this we may consider IV steroids and  disease modifying therapy.   PLAN: - repeat MRIs to look for progression or flare up - then may consider copaxone - continue pain mgmt and mood stabilization per PCP; consider psychology/psychiatry evaluation and pain mgmt referral  Orders Placed This Encounter  Procedures  . MR Brain W Wo Contrast  . MR Cervical Spine W Wo Contrast  . MR Thoracic Spine W Wo Contrast   Return in about 1 month (around 06/21/2015).    Suanne Marker, MD 05/22/2015, 4:02 PM Certified in Neurology, Neurophysiology and Neuroimaging  [facility] [address], [address] [place], [place] [address] [phone]

## 2015-05-22 NOTE — Patient Instructions (Signed)
I will check MRI scans. 

## 2015-06-05 DIAGNOSIS — G894 Chronic pain syndrome: Secondary | ICD-10-CM | POA: Diagnosis not present

## 2015-06-05 DIAGNOSIS — G35 Multiple sclerosis: Secondary | ICD-10-CM | POA: Diagnosis not present

## 2015-06-05 DIAGNOSIS — M25562 Pain in left knee: Secondary | ICD-10-CM | POA: Diagnosis not present

## 2015-06-21 ENCOUNTER — Ambulatory Visit: Payer: Medicare Other | Admitting: Diagnostic Neuroimaging

## 2015-07-17 ENCOUNTER — Ambulatory Visit
Admission: RE | Admit: 2015-07-17 | Discharge: 2015-07-17 | Disposition: A | Discharge status: Home / Self Care | Payer: Medicare Other | Source: Ambulatory Visit | Attending: Diagnostic Neuroimaging | Admitting: Diagnostic Neuroimaging

## 2015-07-17 DIAGNOSIS — G35 Multiple sclerosis: Secondary | ICD-10-CM

## 2015-07-17 DIAGNOSIS — G35D Multiple sclerosis, unspecified: Secondary | ICD-10-CM

## 2015-07-25 ENCOUNTER — Ambulatory Visit
Admission: RE | Admit: 2015-07-25 | Discharge: 2015-07-25 | Disposition: A | Discharge status: Home / Self Care | Payer: Medicare Other | Source: Ambulatory Visit | Attending: Diagnostic Neuroimaging | Admitting: Diagnostic Neuroimaging

## 2015-07-25 ENCOUNTER — Other Ambulatory Visit: Payer: Self-pay | Admitting: Diagnostic Neuroimaging

## 2015-07-25 DIAGNOSIS — G35 Multiple sclerosis: Secondary | ICD-10-CM

## 2015-08-03 ENCOUNTER — Ambulatory Visit
Admission: RE | Admit: 2015-08-03 | Discharge: 2015-08-03 | Disposition: A | Discharge status: Home / Self Care | Payer: Medicare Other | Source: Ambulatory Visit | Attending: Diagnostic Neuroimaging | Admitting: Diagnostic Neuroimaging

## 2015-08-03 DIAGNOSIS — G35 Multiple sclerosis: Secondary | ICD-10-CM | POA: Diagnosis not present

## 2015-08-03 MED ORDER — GADOBENATE DIMEGLUMINE 529 MG/ML IV SOLN
20.0000 mL | Freq: Once | INTRAVENOUS | Status: AC | PRN
  Administered 2015-08-03: 20 mL via INTRAVENOUS

## 2015-08-07 DIAGNOSIS — M545 Low back pain: Secondary | ICD-10-CM | POA: Diagnosis not present

## 2015-08-07 DIAGNOSIS — G894 Chronic pain syndrome: Secondary | ICD-10-CM | POA: Diagnosis not present

## 2015-08-07 DIAGNOSIS — F419 Anxiety disorder, unspecified: Secondary | ICD-10-CM | POA: Diagnosis not present

## 2015-10-16 DIAGNOSIS — G35 Multiple sclerosis: Secondary | ICD-10-CM | POA: Diagnosis not present

## 2015-10-16 DIAGNOSIS — G8929 Other chronic pain: Secondary | ICD-10-CM | POA: Diagnosis not present

## 2015-10-16 DIAGNOSIS — Z79891 Long term (current) use of opiate analgesic: Secondary | ICD-10-CM | POA: Diagnosis not present

## 2015-10-16 DIAGNOSIS — M5442 Lumbago with sciatica, left side: Secondary | ICD-10-CM | POA: Diagnosis not present

## 2015-10-16 DIAGNOSIS — M542 Cervicalgia: Secondary | ICD-10-CM | POA: Diagnosis not present

## 2015-10-16 DIAGNOSIS — G894 Chronic pain syndrome: Secondary | ICD-10-CM | POA: Diagnosis not present

## 2015-10-30 DIAGNOSIS — Z79891 Long term (current) use of opiate analgesic: Secondary | ICD-10-CM | POA: Diagnosis not present

## 2015-10-30 DIAGNOSIS — M542 Cervicalgia: Secondary | ICD-10-CM | POA: Diagnosis not present

## 2015-10-30 DIAGNOSIS — M5442 Lumbago with sciatica, left side: Secondary | ICD-10-CM | POA: Diagnosis not present

## 2015-10-30 DIAGNOSIS — G894 Chronic pain syndrome: Secondary | ICD-10-CM | POA: Diagnosis not present

## 2015-10-30 DIAGNOSIS — G35 Multiple sclerosis: Secondary | ICD-10-CM | POA: Diagnosis not present

## 2015-10-30 DIAGNOSIS — G8929 Other chronic pain: Secondary | ICD-10-CM | POA: Diagnosis not present

## 2015-11-13 DIAGNOSIS — Z79891 Long term (current) use of opiate analgesic: Secondary | ICD-10-CM | POA: Diagnosis not present

## 2015-11-13 DIAGNOSIS — M5442 Lumbago with sciatica, left side: Secondary | ICD-10-CM | POA: Diagnosis not present

## 2015-11-13 DIAGNOSIS — G8929 Other chronic pain: Secondary | ICD-10-CM | POA: Diagnosis not present

## 2015-11-13 DIAGNOSIS — G894 Chronic pain syndrome: Secondary | ICD-10-CM | POA: Diagnosis not present

## 2015-11-13 DIAGNOSIS — G35 Multiple sclerosis: Secondary | ICD-10-CM | POA: Diagnosis not present

## 2015-11-13 DIAGNOSIS — M542 Cervicalgia: Secondary | ICD-10-CM | POA: Diagnosis not present

## 2015-11-29 DIAGNOSIS — M5442 Lumbago with sciatica, left side: Secondary | ICD-10-CM | POA: Diagnosis not present

## 2015-11-29 DIAGNOSIS — G894 Chronic pain syndrome: Secondary | ICD-10-CM | POA: Diagnosis not present

## 2015-11-29 DIAGNOSIS — M542 Cervicalgia: Secondary | ICD-10-CM | POA: Diagnosis not present

## 2015-11-29 DIAGNOSIS — G8929 Other chronic pain: Secondary | ICD-10-CM | POA: Diagnosis not present

## 2015-11-29 DIAGNOSIS — Z79891 Long term (current) use of opiate analgesic: Secondary | ICD-10-CM | POA: Diagnosis not present

## 2015-11-29 DIAGNOSIS — G35 Multiple sclerosis: Secondary | ICD-10-CM | POA: Diagnosis not present

## 2015-12-11 DIAGNOSIS — G894 Chronic pain syndrome: Secondary | ICD-10-CM | POA: Diagnosis not present

## 2015-12-11 DIAGNOSIS — M542 Cervicalgia: Secondary | ICD-10-CM | POA: Diagnosis not present

## 2015-12-11 DIAGNOSIS — Z79891 Long term (current) use of opiate analgesic: Secondary | ICD-10-CM | POA: Diagnosis not present

## 2015-12-11 DIAGNOSIS — G35 Multiple sclerosis: Secondary | ICD-10-CM | POA: Diagnosis not present

## 2015-12-11 DIAGNOSIS — G8929 Other chronic pain: Secondary | ICD-10-CM | POA: Diagnosis not present

## 2015-12-11 DIAGNOSIS — M5442 Lumbago with sciatica, left side: Secondary | ICD-10-CM | POA: Diagnosis not present

## 2016-01-17 DIAGNOSIS — F419 Anxiety disorder, unspecified: Secondary | ICD-10-CM | POA: Diagnosis not present

## 2016-07-07 IMAGING — CR DG ABDOMEN ACUTE W/ 1V CHEST
4 series · 4 of 4 positions shown · non-contrast
Comparison: None.

CLINICAL DATA: Severe abdominal pain

EXAM:
ACUTE ABDOMEN SERIES (ABDOMEN 2 VIEW & CHEST 1 VIEW)

[w chest pa]
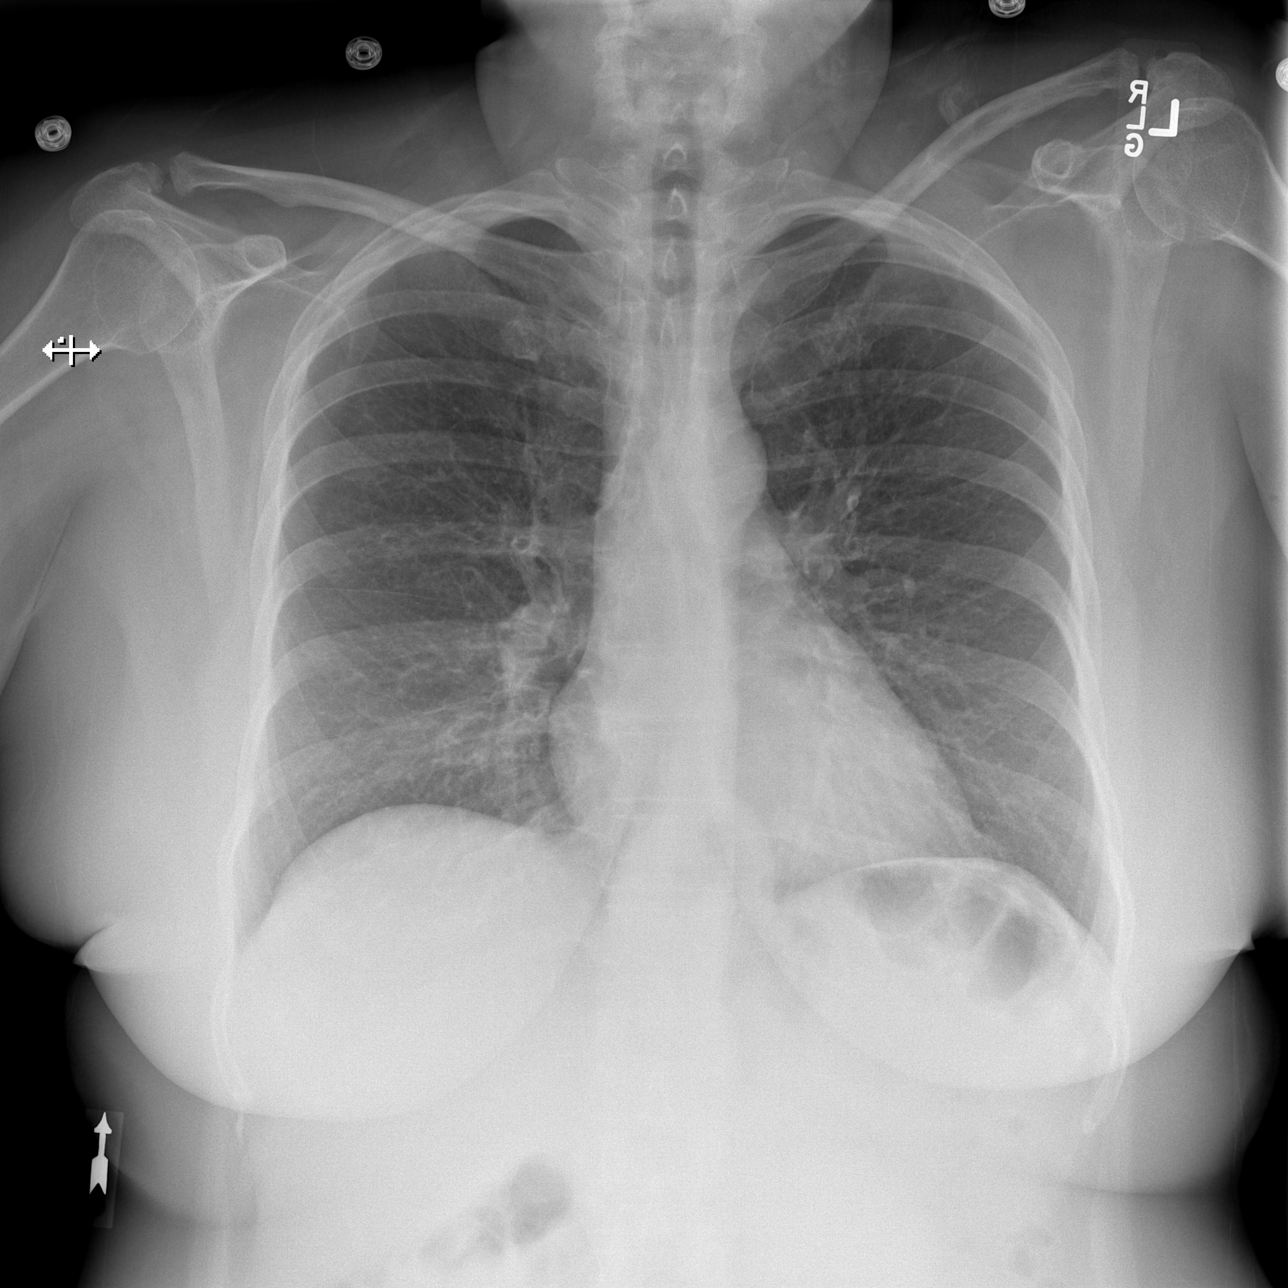

[w abdomen upright]
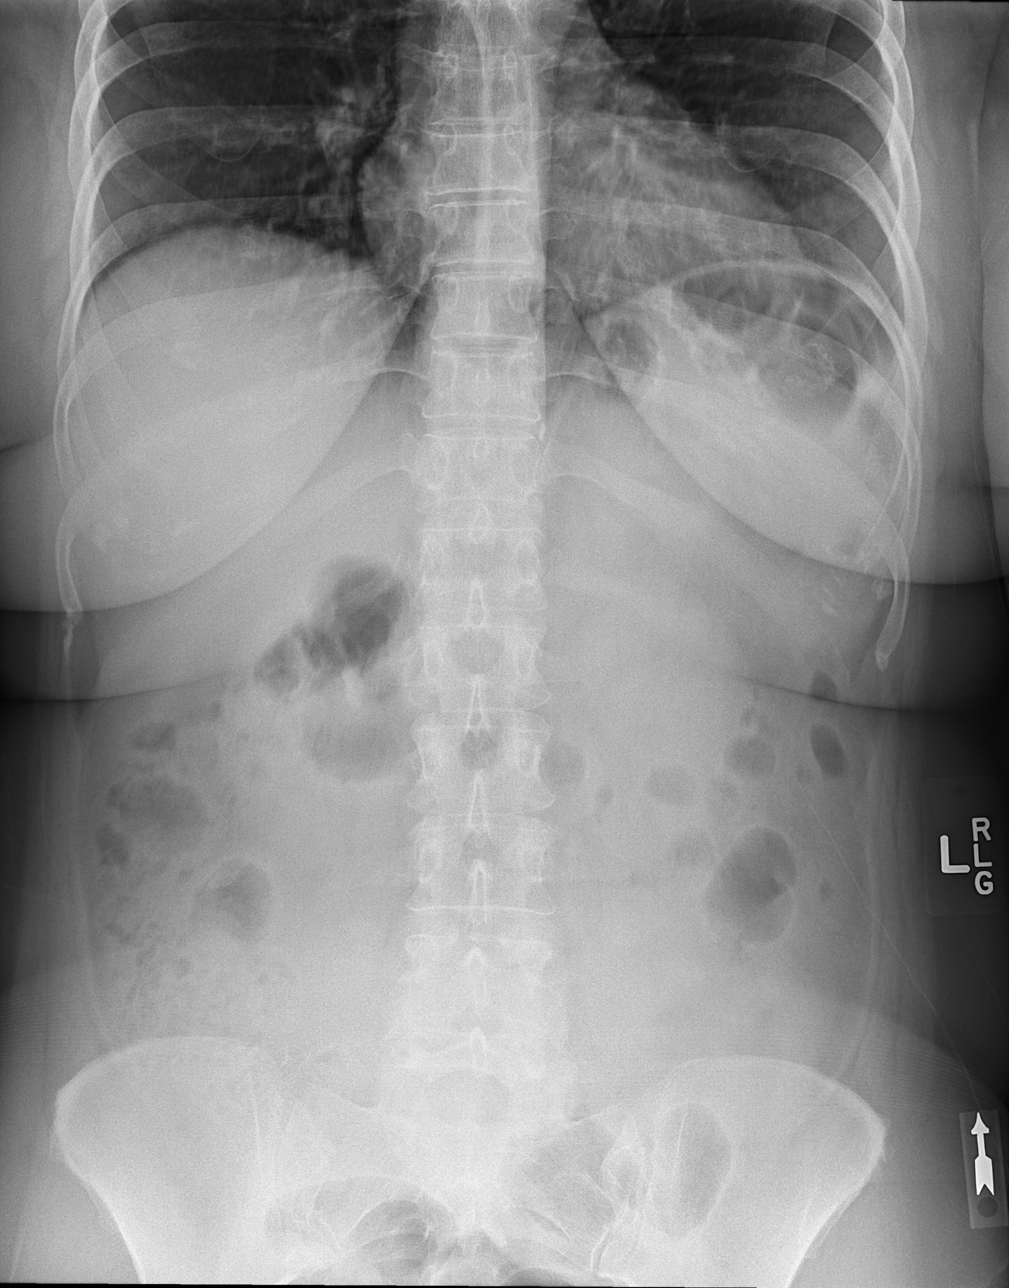

[t abdomen supine (1 of 2)]
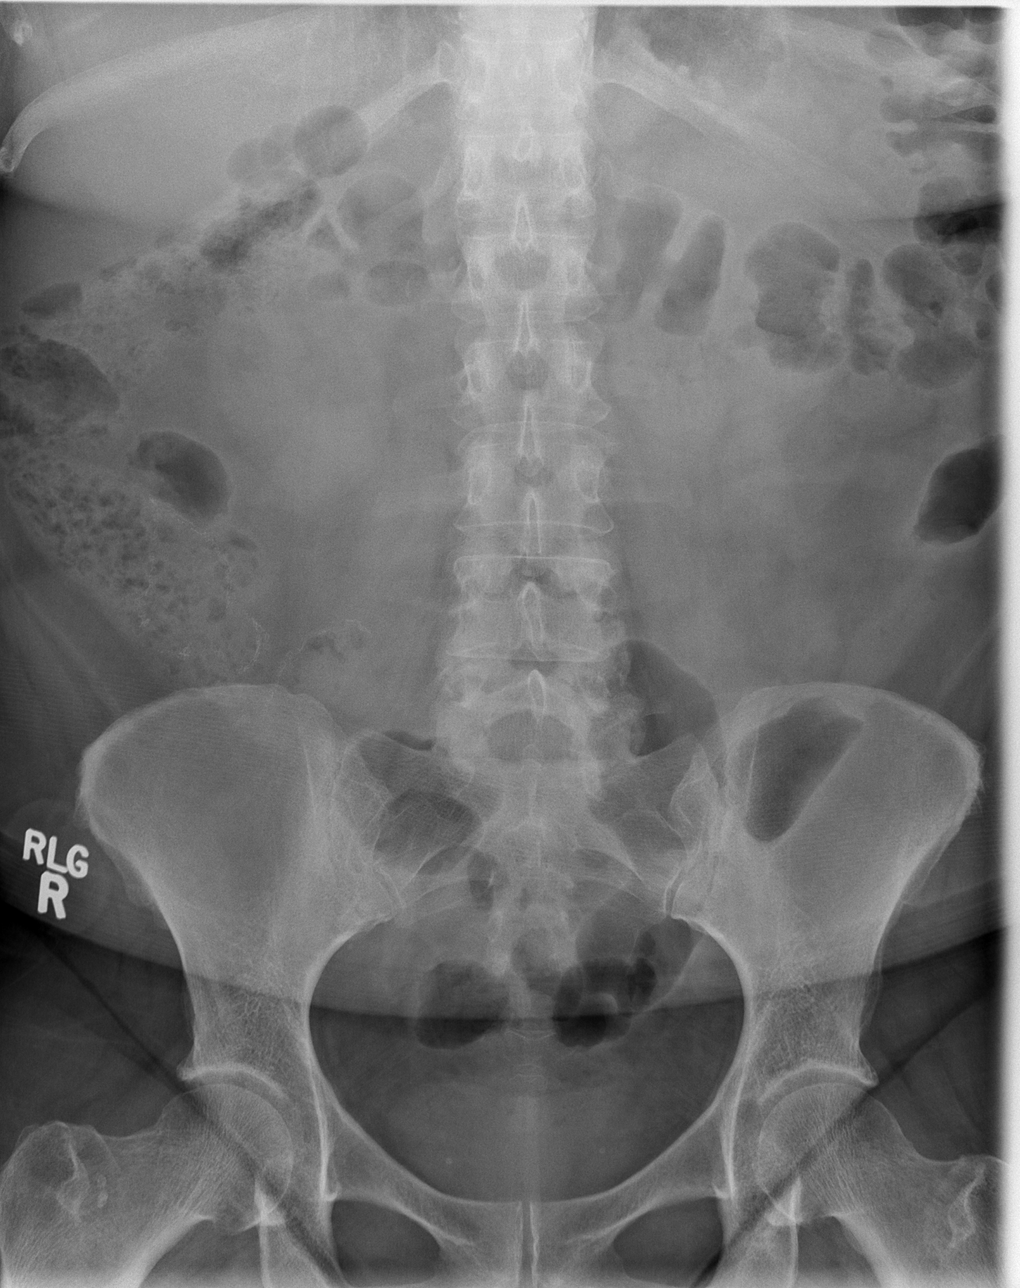

[t abdomen supine (2 of 2)]
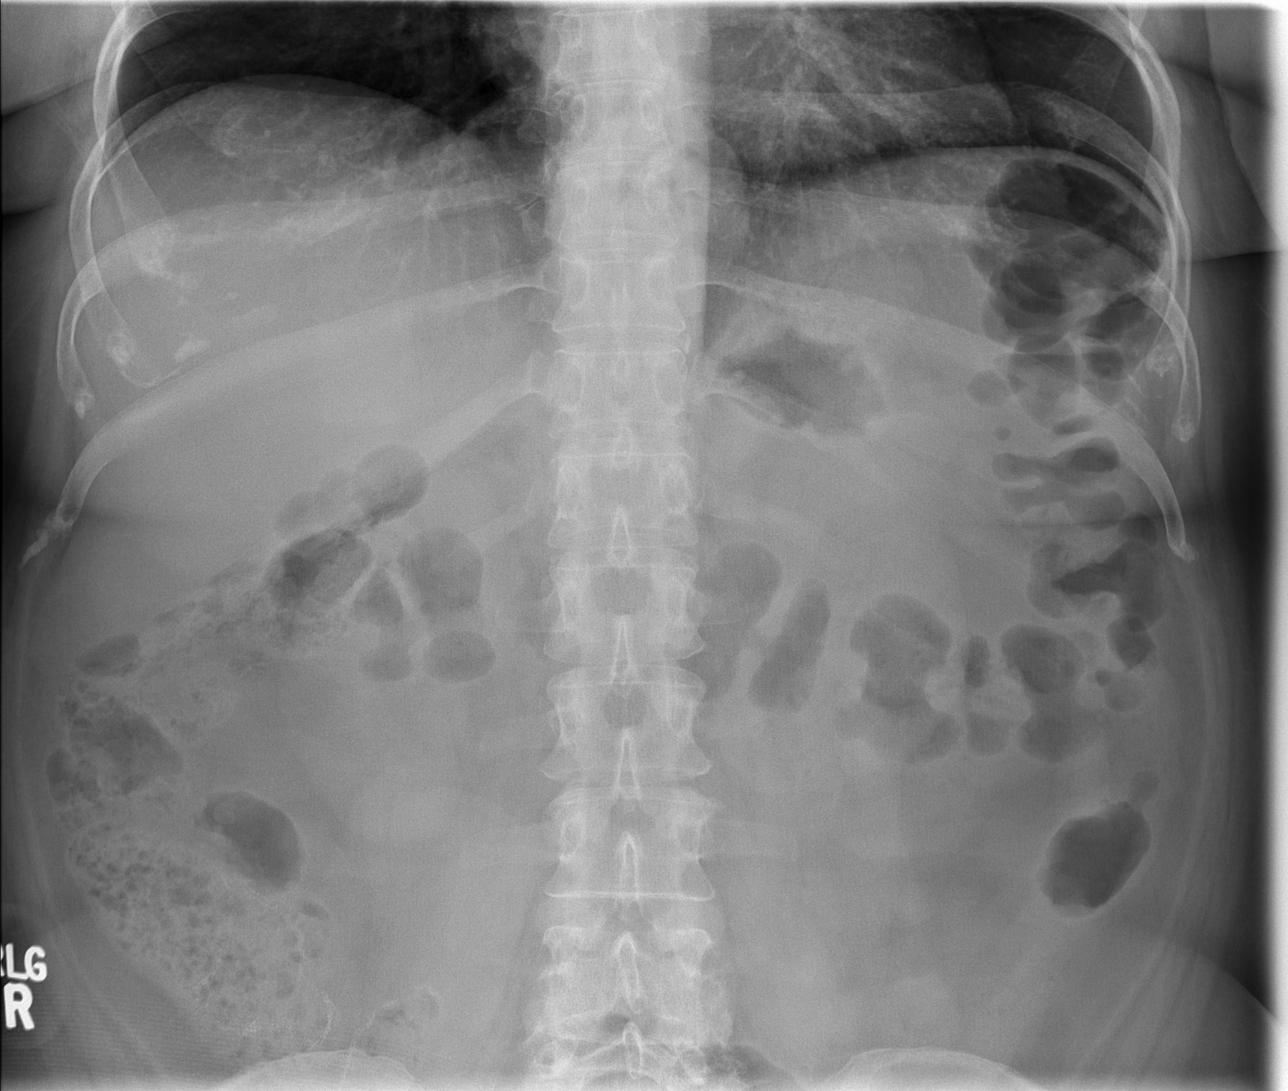

[4 of 4 positions shown; findings below may reference images not displayed]

FINDINGS: Lungs are clear.  No free air beneath hemidiaphragm.

Midline loops of large or small bowel. Surgical clips in the right
abdomen associated with the colon. Gas in the rectum. No
organomegaly. No pathologic calcifications.
IMPRESSION: 1.  No acute cardiopulmonary process.
2. No bowel obstruction or  free air.
3. Prior bowel surgery in the right lower quadrant.

## 2017-06-30 DIAGNOSIS — K8050 Calculus of bile duct without cholangitis or cholecystitis without obstruction: Secondary | ICD-10-CM | POA: Diagnosis not present

## 2017-06-30 DIAGNOSIS — K810 Acute cholecystitis: Secondary | ICD-10-CM | POA: Diagnosis not present

## 2017-06-30 DIAGNOSIS — K824 Cholesterolosis of gallbladder: Secondary | ICD-10-CM | POA: Diagnosis not present

## 2017-07-02 DIAGNOSIS — Z980 Intestinal bypass and anastomosis status: Secondary | ICD-10-CM | POA: Diagnosis not present

## 2017-07-02 DIAGNOSIS — K839 Disease of biliary tract, unspecified: Secondary | ICD-10-CM | POA: Diagnosis not present

## 2017-07-02 DIAGNOSIS — K8046 Calculus of bile duct with acute and chronic cholecystitis without obstruction: Secondary | ICD-10-CM | POA: Diagnosis not present

## 2017-07-02 DIAGNOSIS — J9811 Atelectasis: Secondary | ICD-10-CM | POA: Diagnosis not present

## 2017-07-02 DIAGNOSIS — Z5331 Laparoscopic surgical procedure converted to open procedure: Secondary | ICD-10-CM | POA: Diagnosis not present

## 2017-07-02 DIAGNOSIS — R7989 Other specified abnormal findings of blood chemistry: Secondary | ICD-10-CM | POA: Diagnosis not present

## 2017-07-02 DIAGNOSIS — R05 Cough: Secondary | ICD-10-CM | POA: Diagnosis not present

## 2017-07-02 DIAGNOSIS — K838 Other specified diseases of biliary tract: Secondary | ICD-10-CM | POA: Diagnosis not present

## 2017-07-02 DIAGNOSIS — F17210 Nicotine dependence, cigarettes, uncomplicated: Secondary | ICD-10-CM | POA: Diagnosis present

## 2017-07-02 DIAGNOSIS — Z9049 Acquired absence of other specified parts of digestive tract: Secondary | ICD-10-CM | POA: Diagnosis not present

## 2017-07-02 DIAGNOSIS — R109 Unspecified abdominal pain: Secondary | ICD-10-CM | POA: Diagnosis not present

## 2017-07-02 DIAGNOSIS — G35 Multiple sclerosis: Secondary | ICD-10-CM | POA: Diagnosis not present

## 2017-07-02 DIAGNOSIS — K810 Acute cholecystitis: Secondary | ICD-10-CM | POA: Diagnosis not present

## 2017-07-02 DIAGNOSIS — K8001 Calculus of gallbladder with acute cholecystitis with obstruction: Secondary | ICD-10-CM | POA: Diagnosis present

## 2017-07-02 DIAGNOSIS — R062 Wheezing: Secondary | ICD-10-CM | POA: Diagnosis not present

## 2017-07-02 DIAGNOSIS — K819 Cholecystitis, unspecified: Secondary | ICD-10-CM | POA: Diagnosis not present

## 2017-07-02 DIAGNOSIS — K9189 Other postprocedural complications and disorders of digestive system: Secondary | ICD-10-CM | POA: Diagnosis not present

## 2017-07-02 DIAGNOSIS — K5710 Diverticulosis of small intestine without perforation or abscess without bleeding: Secondary | ICD-10-CM | POA: Diagnosis present

## 2017-07-02 DIAGNOSIS — Z79899 Other long term (current) drug therapy: Secondary | ICD-10-CM | POA: Diagnosis not present

## 2017-07-02 DIAGNOSIS — J45909 Unspecified asthma, uncomplicated: Secondary | ICD-10-CM | POA: Diagnosis not present

## 2017-07-04 DIAGNOSIS — R109 Unspecified abdominal pain: Secondary | ICD-10-CM | POA: Diagnosis not present

## 2017-07-04 DIAGNOSIS — K819 Cholecystitis, unspecified: Secondary | ICD-10-CM | POA: Diagnosis not present

## 2017-07-07 DIAGNOSIS — K839 Disease of biliary tract, unspecified: Secondary | ICD-10-CM | POA: Diagnosis not present

## 2017-07-07 DIAGNOSIS — K838 Other specified diseases of biliary tract: Secondary | ICD-10-CM | POA: Diagnosis not present

## 2017-08-21 DIAGNOSIS — E559 Vitamin D deficiency, unspecified: Secondary | ICD-10-CM | POA: Diagnosis not present

## 2017-08-21 DIAGNOSIS — E785 Hyperlipidemia, unspecified: Secondary | ICD-10-CM | POA: Diagnosis not present

## 2017-08-21 DIAGNOSIS — Z1231 Encounter for screening mammogram for malignant neoplasm of breast: Secondary | ICD-10-CM | POA: Diagnosis not present

## 2017-08-21 DIAGNOSIS — F339 Major depressive disorder, recurrent, unspecified: Secondary | ICD-10-CM | POA: Diagnosis not present

## 2017-08-21 DIAGNOSIS — Z0000 Encounter for general adult medical examination without abnormal findings: Secondary | ICD-10-CM | POA: Diagnosis not present

## 2017-08-21 DIAGNOSIS — F17200 Nicotine dependence, unspecified, uncomplicated: Secondary | ICD-10-CM | POA: Diagnosis not present

## 2017-08-21 DIAGNOSIS — E663 Overweight: Secondary | ICD-10-CM | POA: Diagnosis not present

## 2017-08-21 DIAGNOSIS — Z713 Dietary counseling and surveillance: Secondary | ICD-10-CM | POA: Diagnosis not present

## 2017-12-04 DIAGNOSIS — N3000 Acute cystitis without hematuria: Secondary | ICD-10-CM | POA: Diagnosis not present

## 2017-12-04 DIAGNOSIS — N390 Urinary tract infection, site not specified: Secondary | ICD-10-CM | POA: Diagnosis not present

## 2018-02-12 DIAGNOSIS — F17200 Nicotine dependence, unspecified, uncomplicated: Secondary | ICD-10-CM | POA: Diagnosis not present

## 2018-02-12 DIAGNOSIS — F17210 Nicotine dependence, cigarettes, uncomplicated: Secondary | ICD-10-CM | POA: Diagnosis not present

## 2018-02-12 DIAGNOSIS — Z8719 Personal history of other diseases of the digestive system: Secondary | ICD-10-CM | POA: Diagnosis not present

## 2018-02-12 DIAGNOSIS — Z9049 Acquired absence of other specified parts of digestive tract: Secondary | ICD-10-CM | POA: Diagnosis not present

## 2018-02-12 DIAGNOSIS — Z4659 Encounter for fitting and adjustment of other gastrointestinal appliance and device: Secondary | ICD-10-CM | POA: Diagnosis not present

## 2019-11-26 DEATH — deceased
